# Patient Record
Sex: Male | Born: 1945 | Race: White | Hispanic: No | Marital: Married
Health system: Southern US, Community
[De-identification: ages and names within clinical notes are randomized; demographics above are authoritative.]

---

## 2012-10-08 ENCOUNTER — Inpatient Hospital Stay: Payer: Self-pay | Admitting: Internal Medicine

## 2012-10-08 LAB — COMPREHENSIVE METABOLIC PANEL
Albumin: 3.7 g/dL (ref 3.4–5.0)
Bilirubin,Total: 0.4 mg/dL (ref 0.2–1.0)
Calcium, Total: 9.1 mg/dL (ref 8.5–10.1)
Chloride: 110 mmol/L — ABNORMAL HIGH (ref 98–107)
Creatinine: 2.94 mg/dL — ABNORMAL HIGH (ref 0.60–1.30)
EGFR (African American): 25 — ABNORMAL LOW
Glucose: 109 mg/dL — ABNORMAL HIGH (ref 65–99)
Potassium: 4 mmol/L (ref 3.5–5.1)
SGOT(AST): 28 U/L (ref 15–37)
SGPT (ALT): 27 U/L (ref 12–78)
Sodium: 141 mmol/L (ref 136–145)
Total Protein: 8.1 g/dL (ref 6.4–8.2)

## 2012-10-08 LAB — URINALYSIS, COMPLETE
Bilirubin,UR: NEGATIVE
Glucose,UR: NEGATIVE mg/dL (ref 0–75)
Ketone: NEGATIVE
Nitrite: NEGATIVE
Protein: 30
RBC,UR: 2 /HPF (ref 0–5)
Specific Gravity: 1.018 (ref 1.003–1.030)
Squamous Epithelial: NONE SEEN
WBC UR: 1 /HPF (ref 0–5)

## 2012-10-08 LAB — TROPONIN I
Troponin-I: 0.17 ng/mL — ABNORMAL HIGH
Troponin-I: 0.05 ng/mL

## 2012-10-08 LAB — CBC
HCT: 46.9 % (ref 40.0–52.0)
MCHC: 34.4 g/dL (ref 32.0–36.0)
MCV: 87 fL (ref 80–100)
RBC: 5.37 10*6/uL (ref 4.40–5.90)
RDW: 15.1 % — ABNORMAL HIGH (ref 11.5–14.5)
WBC: 9.5 10*3/uL (ref 3.8–10.6)

## 2012-10-08 LAB — APTT
Activated PTT: 29.6 secs (ref 23.6–35.9)
Activated PTT: >160 secs (ref 23.6–35.9)

## 2012-10-08 LAB — CK TOTAL AND CKMB (NOT AT ARMC)
CK, Total: 75 U/L (ref 35–232)
CK, Total: 70 U/L (ref 35–232)
CK, Total: 100 U/L (ref 35–232)
CK-MB: 1.4 ng/mL (ref 0.5–3.6)
CK-MB: 2.4 ng/mL (ref 0.5–3.6)

## 2012-10-09 LAB — CBC WITH DIFFERENTIAL/PLATELET
Eosinophil #: 0 10*3/uL (ref 0.0–0.7)
Eosinophil %: 0 %
HCT: 39.7 % — ABNORMAL LOW (ref 40.0–52.0)
HGB: 13.6 g/dL (ref 13.0–18.0)
Lymphocyte #: 0.4 10*3/uL — ABNORMAL LOW (ref 1.0–3.6)
Lymphocyte %: 6 %
MCH: 30.1 pg (ref 26.0–34.0)
MCHC: 34.3 g/dL (ref 32.0–36.0)
MCV: 88 fL (ref 80–100)
Monocyte %: 1.9 %
Neutrophil #: 5.9 10*3/uL (ref 1.4–6.5)
Neutrophil %: 91.9 %
RBC: 4.52 10*6/uL (ref 4.40–5.90)
RDW: 14.8 % — ABNORMAL HIGH (ref 11.5–14.5)
WBC: 6.4 10*3/uL (ref 3.8–10.6)

## 2012-10-09 LAB — BASIC METABOLIC PANEL
Calcium, Total: 8.5 mg/dL (ref 8.5–10.1)
EGFR (African American): 26 — ABNORMAL LOW
EGFR (Non-African Amer.): 23 — ABNORMAL LOW
Glucose: 192 mg/dL — ABNORMAL HIGH (ref 65–99)
Potassium: 4 mmol/L (ref 3.5–5.1)
Sodium: 141 mmol/L (ref 136–145)

## 2012-10-09 LAB — LIPID PANEL
Cholesterol: 130 mg/dL (ref 0–200)
HDL Cholesterol: 31 mg/dL — ABNORMAL LOW (ref 40–60)
Ldl Cholesterol, Calc: 73 mg/dL (ref 0–100)
Triglycerides: 129 mg/dL (ref 0–200)
VLDL Cholesterol, Calc: 26 mg/dL (ref 5–40)

## 2012-10-09 LAB — APTT: Activated PTT: 90.3 secs — ABNORMAL HIGH (ref 23.6–35.9)

## 2012-10-10 LAB — CBC WITH DIFFERENTIAL/PLATELET
Basophil #: 0 10*3/uL (ref 0.0–0.1)
Eosinophil %: 0 %
HGB: 13.7 g/dL (ref 13.0–18.0)
Lymphocyte #: 0.4 10*3/uL — ABNORMAL LOW (ref 1.0–3.6)
Lymphocyte %: 4.3 %
MCHC: 34 g/dL (ref 32.0–36.0)
MCV: 89 fL (ref 80–100)
Monocyte #: 0.3 x10 3/mm (ref 0.2–1.0)
Monocyte %: 3.5 %
Neutrophil %: 92 %
Platelet: 134 10*3/uL — ABNORMAL LOW (ref 150–440)
RDW: 15.3 % — ABNORMAL HIGH (ref 11.5–14.5)

## 2012-10-10 LAB — BASIC METABOLIC PANEL
Anion Gap: 7 (ref 7–16)
BUN: 26 mg/dL — ABNORMAL HIGH (ref 7–18)
Calcium, Total: 8.7 mg/dL (ref 8.5–10.1)
Creatinine: 2.38 mg/dL — ABNORMAL HIGH (ref 0.60–1.30)
EGFR (Non-African Amer.): 27 — ABNORMAL LOW
Sodium: 139 mmol/L (ref 136–145)

## 2012-10-11 LAB — BASIC METABOLIC PANEL
Anion Gap: 5 — ABNORMAL LOW (ref 7–16)
Calcium, Total: 8.4 mg/dL — ABNORMAL LOW (ref 8.5–10.1)
Chloride: 109 mmol/L — ABNORMAL HIGH (ref 98–107)
Co2: 26 mmol/L (ref 21–32)
Potassium: 4.2 mmol/L (ref 3.5–5.1)

## 2012-10-13 LAB — CULTURE, BLOOD (SINGLE)

## 2014-01-28 IMAGING — CR DG CHEST 2V
1 series · 2 of 2 positions shown · non-contrast
Comparison: none

REASON FOR EXAM: SOB
COMMENTS:

PROCEDURE:     DXR - DXR CHEST PA (OR AP) AND LATERAL  - October 08, 2012 [DATE]
RESULT:     The lungs are clear. The cardiac silhouette and visualized bony
skeleton are unremarkable. Patient's taken a shallow inspiration which
accentuates the interstitial findings.

[Series 1: x chest ap · 0.14mm/px · 2 of 2 slices shown]
[im 1/2]
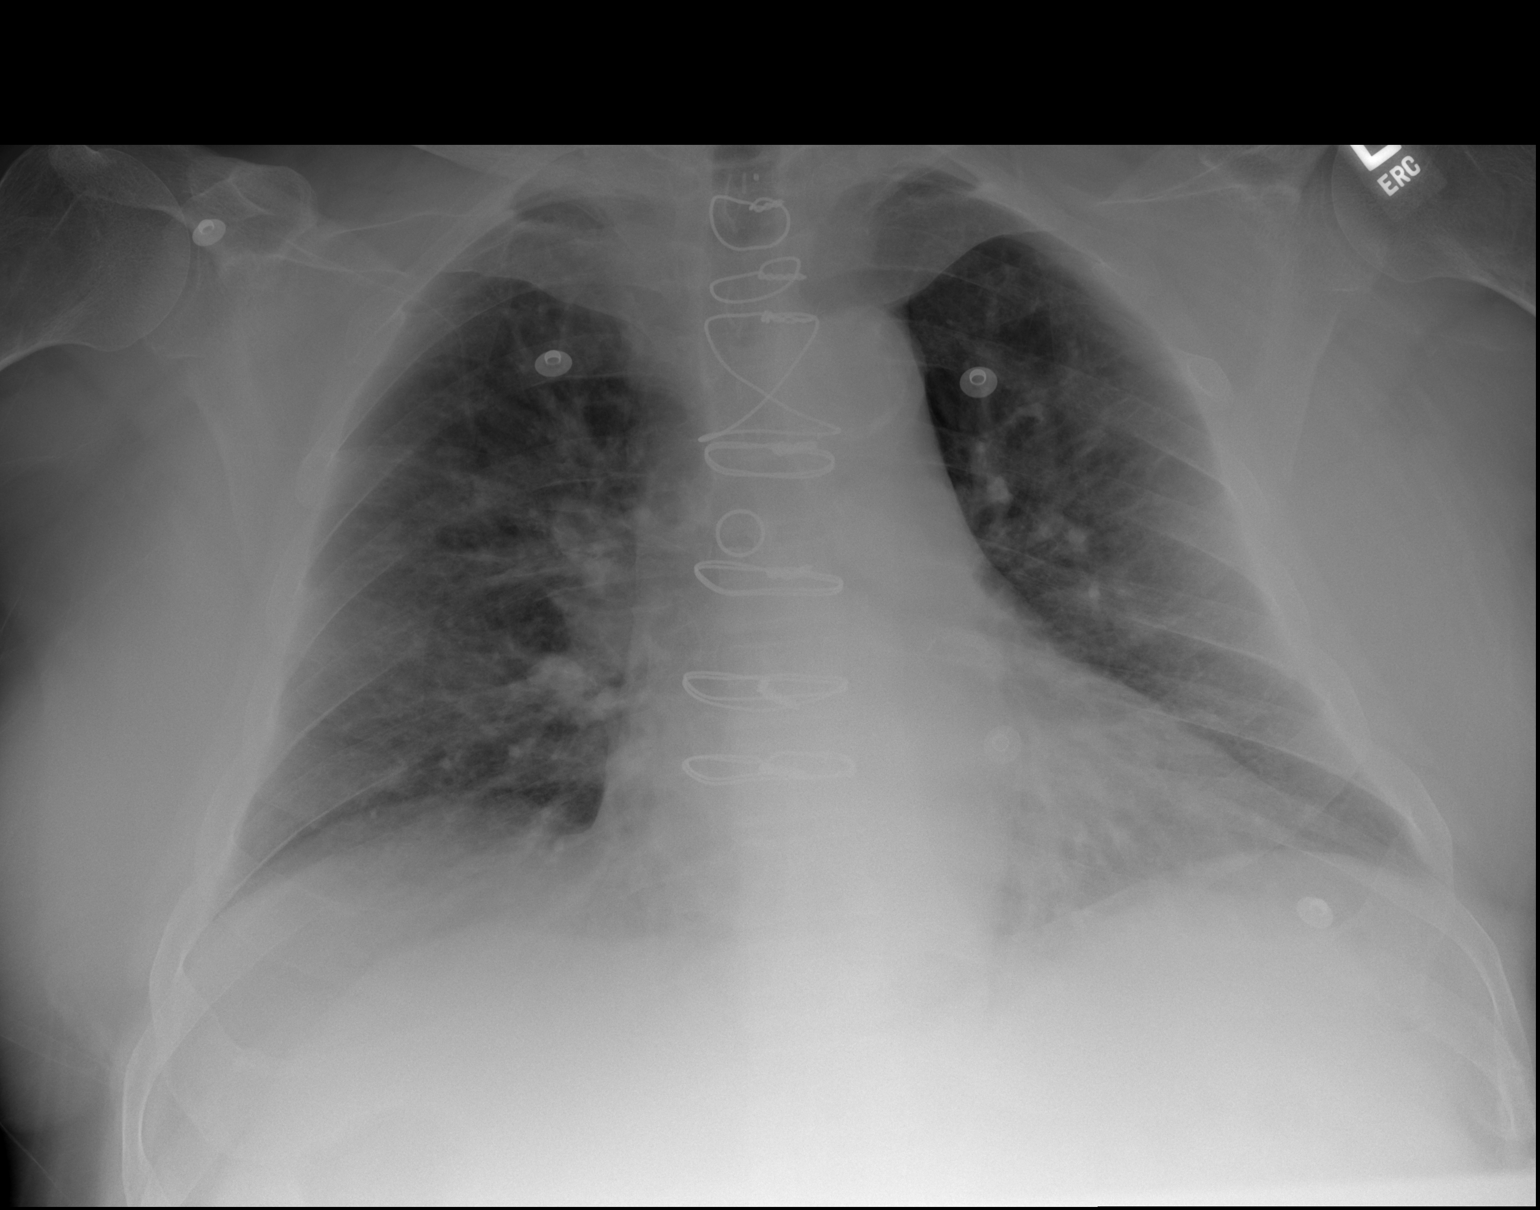
[im 2/2]
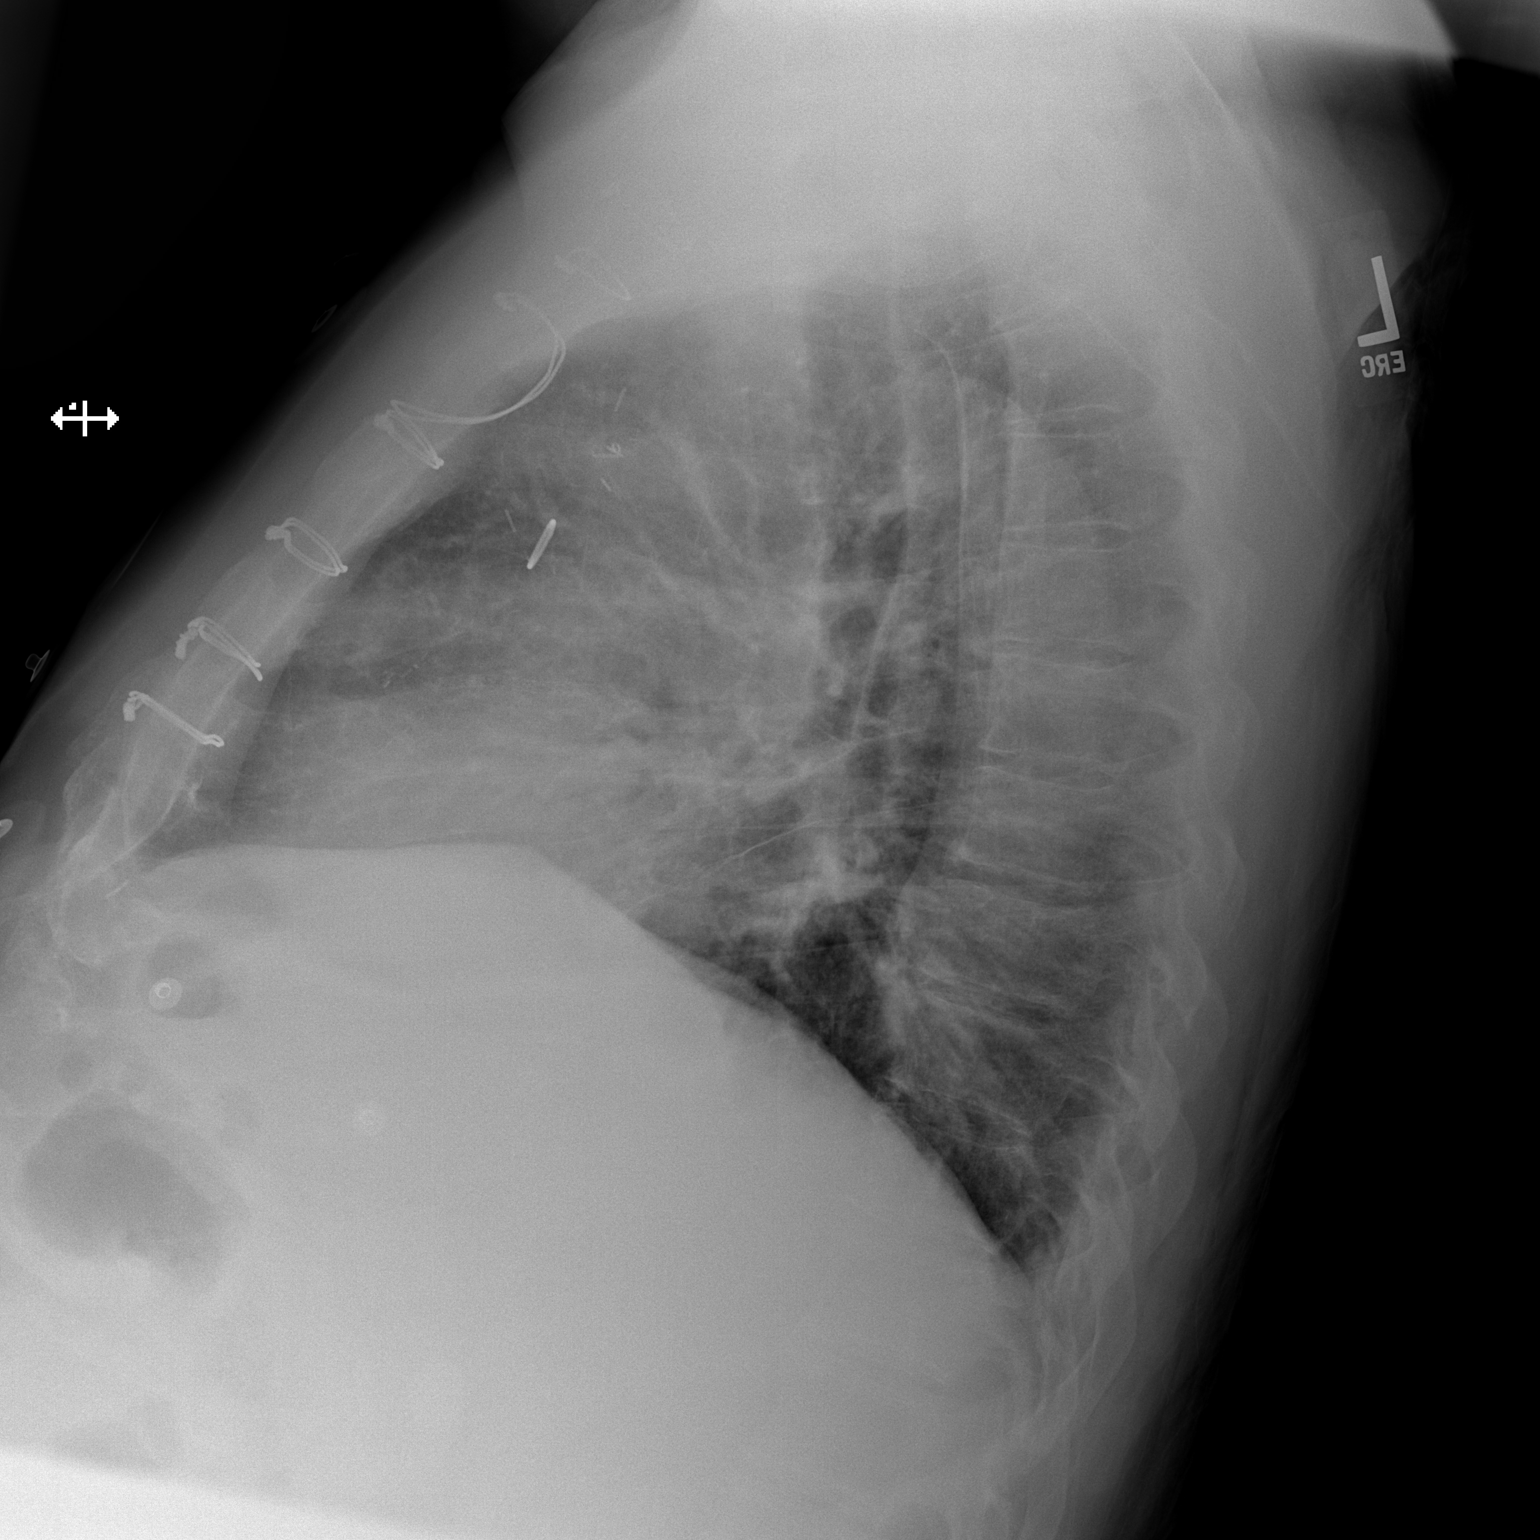

[2 of 2 positions shown; findings below may reference images not displayed]

IMPRESSION: 1. Chest radiograph without evidence of acute cardiopulmonary disease.

## 2014-01-29 IMAGING — CT CT ABD-PELV W/O CM
1 of 2 series · 14 of 32 positions shown, 18 images · non-contrast
Comparison: none

REASON FOR EXAM: (1) abdominal pain; (2) abdominal pain;    NOTE: Nursing
to Give Oral CT Contras
COMMENTS:

[Series 2: soft tissue · axial · 0.98mm/px · z∈[-1220,-689]mm · 14 of 195 slices shown, 18 images]
[im 9/195  soft-tissue]
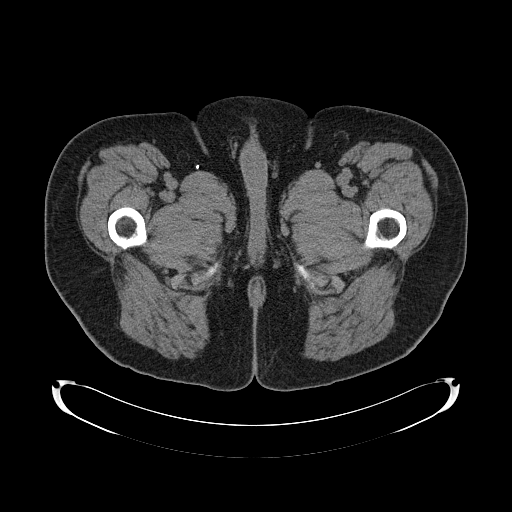
[im 9/195  bone]
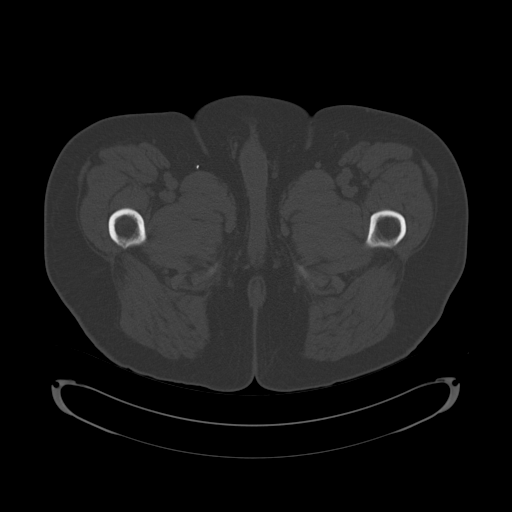
[im 26/195  soft-tissue]
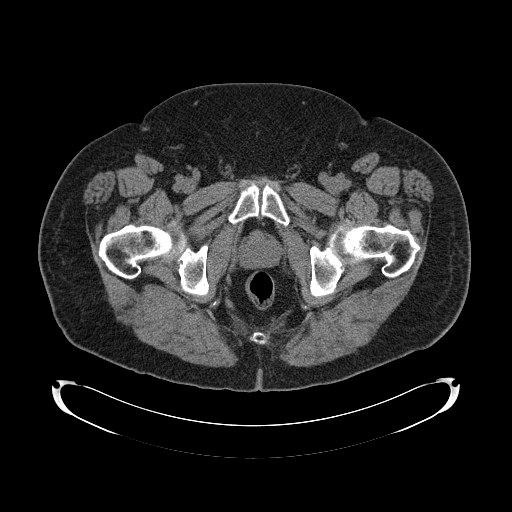
[im 43/195  soft-tissue]
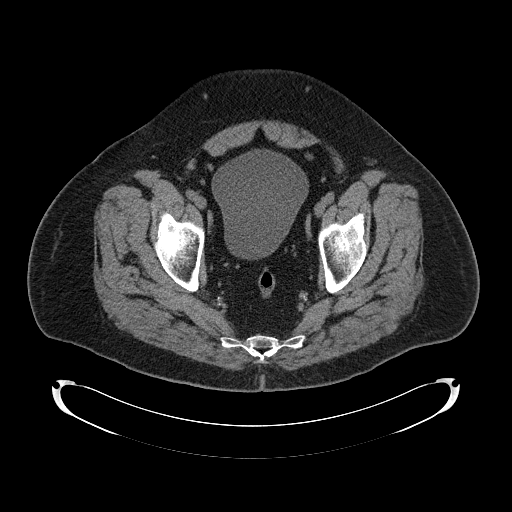
[im 60/195  soft-tissue]
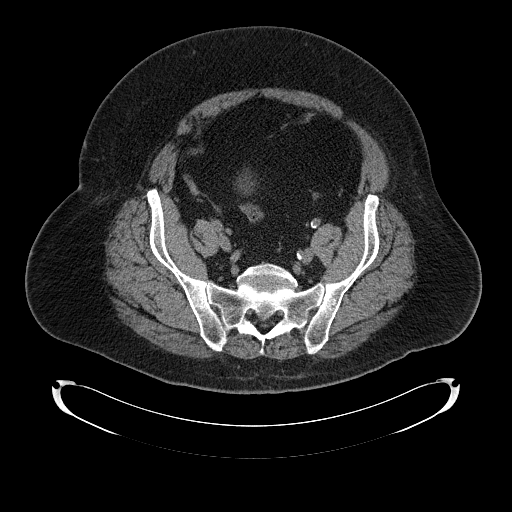
[im 76/195  soft-tissue]
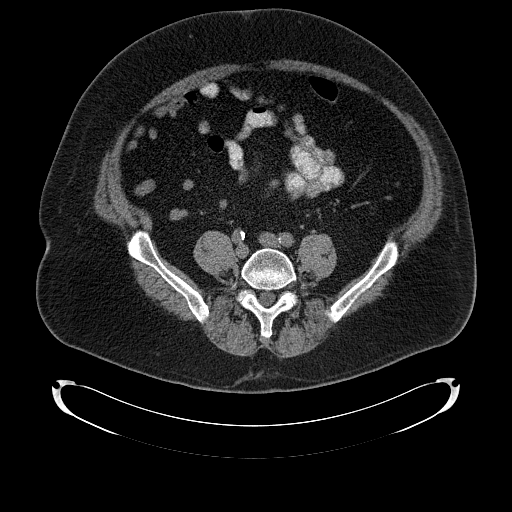
[im 93/195  soft-tissue]
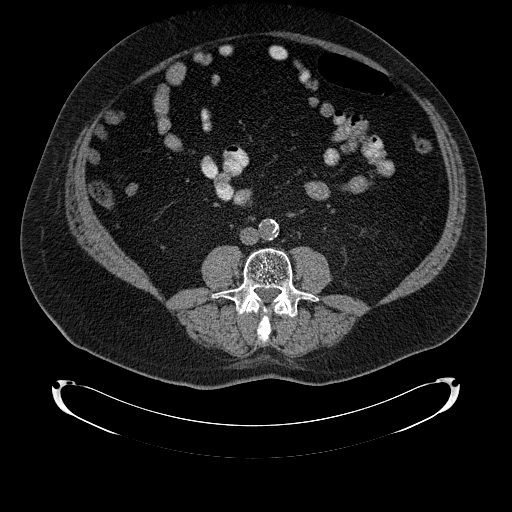
[im 102/195  soft-tissue]
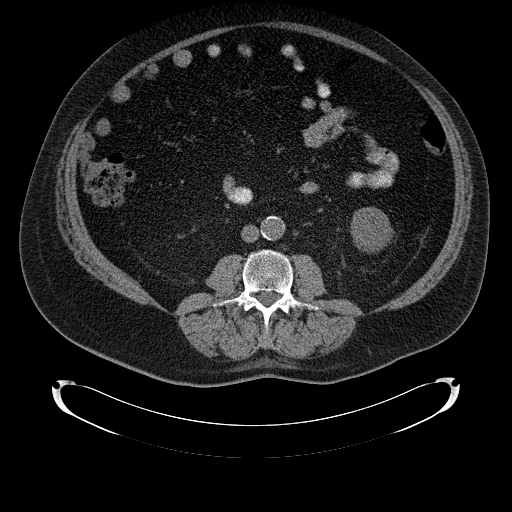
[im 119/195  soft-tissue]
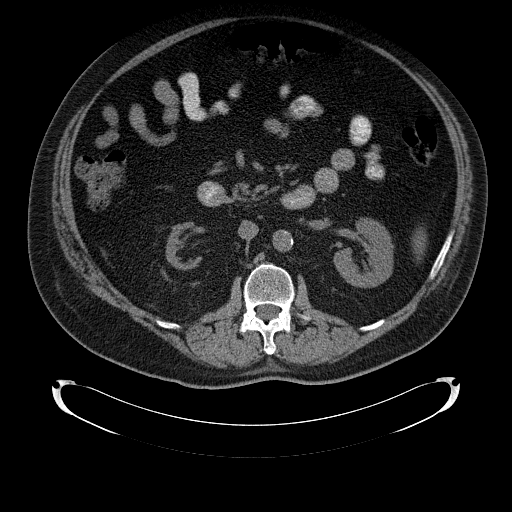
[im 135/195  soft-tissue]
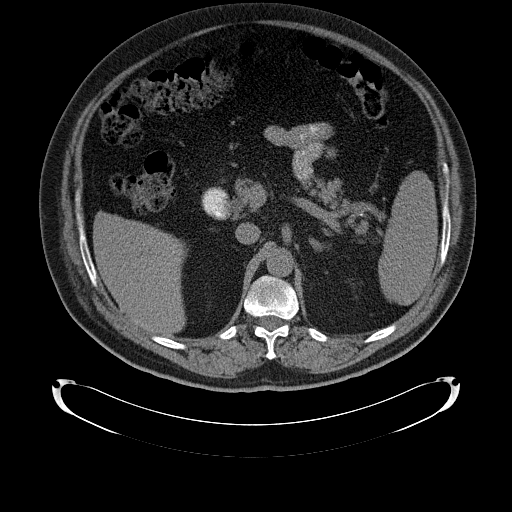
[im 135/195  bone]
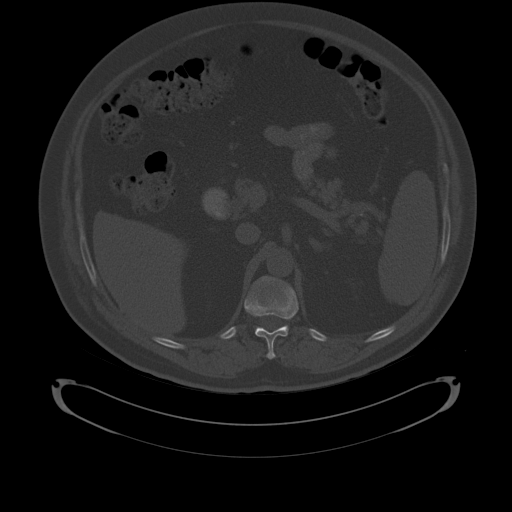
[im 152/195  soft-tissue]
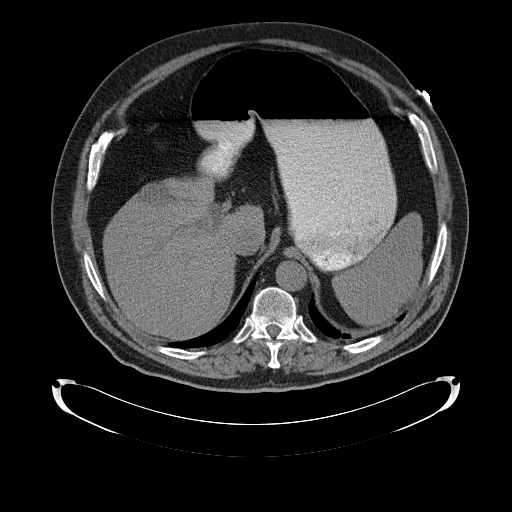
[im 161/195  lung]
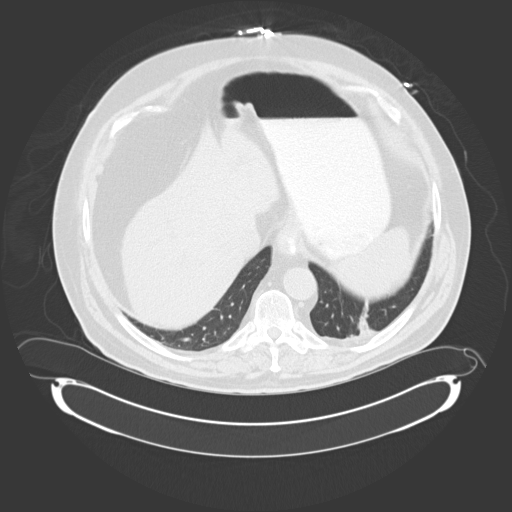
[im 169/195  soft-tissue]
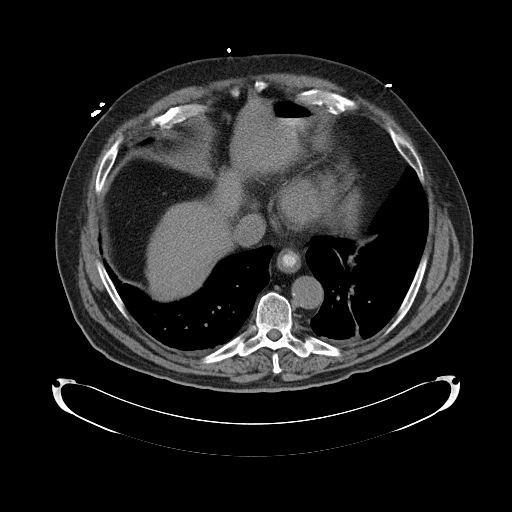
[im 169/195  lung]
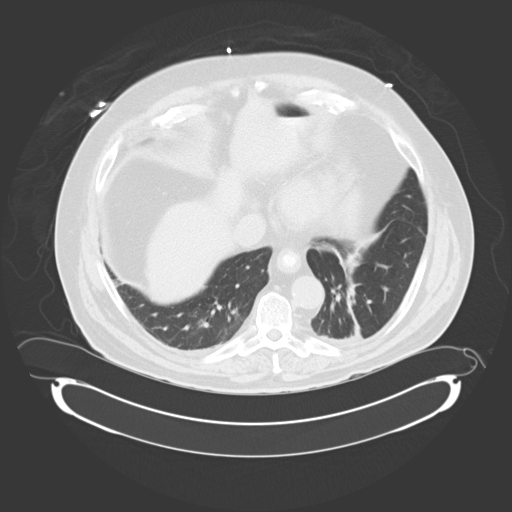
[im 178/195  lung]
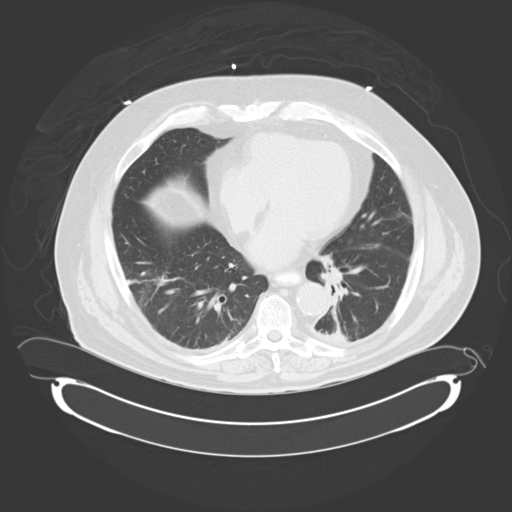
[im 186/195  soft-tissue]
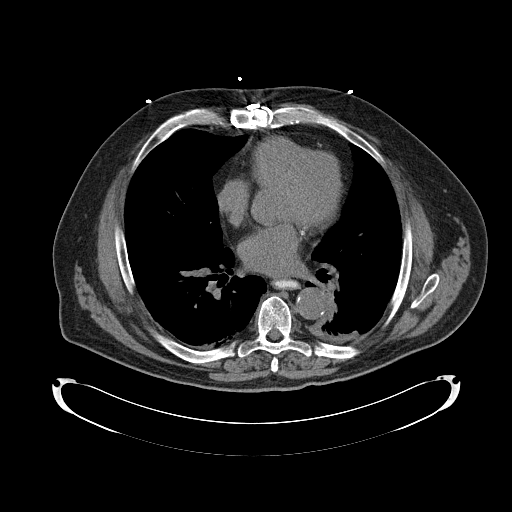
[im 186/195  lung]
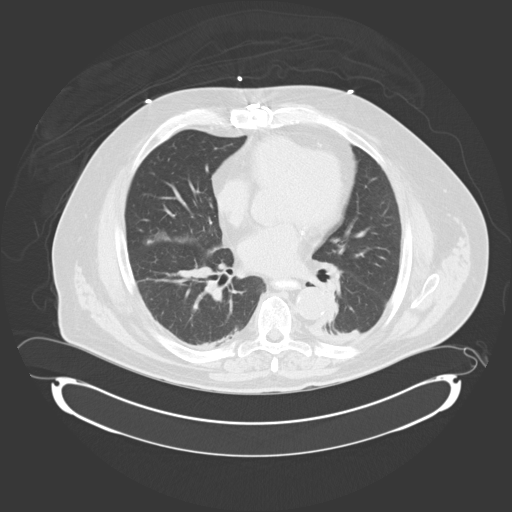

[14 of 32 positions shown; findings below may reference images not displayed]

PROCEDURE:     CT  - CT ABDOMEN AND PELVIS W[DATE]  [DATE]

RESULT:     Axial CT scanning was performed through the abdomen and pelvis
following administration of oral contrast material only. Review of
multiplanar reconstructed images was performed separately on the VIA monitor.

The stomach is moderately distended with contrast. A moderate amount of
contrast is present within portions of the proximal and mid small bowel.
None has reached the distal small bowel or colon. The small and large bowel
loops do not appear abnormally distended. There are no inflammatory changes
of the small or large bowel. The rectosigmoid colon is nondistended and
grossly normal in appearance.

There is a densely calcified gallstone measuring 1.8 cm in diameter in the
gallbladder neck. No pericholecystic inflammatory change is demonstrated.
The liver, spleen, pancreas, adrenal glands, and left kidney are normal in
appearance. The right kidney is atrophic. The abdominal aorta exhibits mild
failure to taper of its caliber. There is no evidence of an aneurysm. The
urinary bladder and prostate gland appear normal. There is no evidence of
ascites. No intra-abdominal or pelvic soft tissue density mass is
demonstrated.

The lumbar vertebral bodies are preserved in height. There is no inguinal or
umbilical hernia. The lung bases reveal patchy increased interstitial
densities in the left base and in the right perihilar and infrahilar regions.
IMPRESSION: 1. There is a large gallstone present without evidence of acute
cholecystitis.
2. The stomach is moderately distended with contrast but contrast has
reached the junction of the middle and distal thirds of the normal-appearing
small bowel. The colon also is normal in appearance.
3. There is right renal atrophy. The left kidney is normal in appearance.
The urinary bladder and prostate gland appear normal.
4. There is no intra-abdominal or pelvic lymphadenopathy. There is no
evidence of ascites.
5. There are patchy increased densities predominantly in the right mid and
lower lung and to a lesser extent at the left lung base. These findings are
worrisome for pneumonia. Followup imaging is recommended to assure clearing
following any antibiotic therapy.

[REDACTED]

## 2014-06-28 NOTE — Discharge Summary (Signed)
PATIENT NAME:  Marcus Hampton, [name] MR#:  [id number] DATE OF BIRTH:  Jul 10, 1945  DATE OF ADMISSION:  10/08/2012 DATE OF DISCHARGE:  10/11/2012  PRESENTING COMPLAINT: Shortness of breath and cough.   NOTE:  This is an addendum to the discharge summary already dictated by Dr. Auburn BilberryShreyang Deontra Pereyra on 10/09/2012. The patient remained in the hospital. The [facility]; however they did not have any beds available over there. Hence, the patient was treated for COPD exacerbation, acute bronchitis, and he improved over the hospital stay. He was ambulated by respiratory therapist. Sats remained 91% on exertion on room air. The patient will complete his antibiotics and steroid taper at home. He was also recommended to follow up [facility] for possible cardiac stress test as outpatient. The patient remained a FULL CODE. The rest of the discharge summary as per Dr. Auburn BilberryShreyang Melia Hopes that was dictated on 10/09/2012.   HOME MEDICATIONS: 1.  Omeprazole/sodium bicarbonate 1 capsule b.i.d.  2.  Docusate 50 mg 2 tablets at bedtime.  3.  Divalproex extended release 250 mg at bedtime.  4.  Lasix 40 mg daily.  5.  Calcitriol 0.25 mcg p.o. daily.  6.  Simvastatin 80 mg 1/2 tablet at bedtime.  7.  Lisinopril 40 mg daily.  8.  Bayer aspirin 81 mg daily.  9.  Metoprolol 25 mg 1/2 tablet b.i.d.  10.  Nitroglycerin 0.4 mg sublingual as needed.  11.  Cetirizine 10 mg p.o. daily.  12.  Prednisone taper.  13.  Keflex 500 mg 3 times a day. 14.  Zithromax daily.  15.  Albuterol ipratropium 1 puff 4 times a day.  16.  Advair 250/50 one puff b.i.d.   DIET: Low sodium.  ACTIVITY LIMITATIONS:  None.   DISCHARGE INSTRUCTIONS:  [facility] in 1 to 2 weeks. [facility] in 2 to 4 weeks.   DIAGNOSTICS:  Labs at discharge: Creatinine was 2.32, BUN is 28 and glucose is 113. White count is 9.8 and H and H are 13.7 and  40.5.   CT of the abdomen and pelvis without contrast showed large gallstone present without evidence of acute cholecystitis. The patient's stomach is moderately distended with contrast reaching the junction of the middle and distal thirds of the normal appearing small bowel. Colon is also normal in appearance. Renal atrophy on the right. There is no intraabdominal pelvic lymphadenopathy. There is patchy increased density predominately the right mid and lower lungs. These findings are worrisome for pneumonia.   Cholesterol is 130, triglycerides 129 and LDL is 73. Blood cultures remain negative in 18 to 24 hours. UA negative for UTI. ____________________________ Marcus KlinefelterSona A. Allena KatzPatel, MD sap:sb D: 10/12/2012 07:31:19 ET T: 10/12/2012 08:52:57 ET JOB#: [id number]  cc: Marcus Hampton A. Allena KatzPatel, MD, <Dictator> [facility] Willow OraSONA A Marcus Estell MD ELECTRONICALLY SIGNED 10/26/2012 7:30

## 2014-06-28 NOTE — Consult Note (Signed)
PATIENT NAME:  Marcus Hampton, [name]:  [id number] DATE OF BIRTH:  1946/01/14  DATE OF CONSULTATION:  10/08/2012  REFERRING PHYSICIAN:  Chiquita LothJade Sung, MD (Emergency Room) CONSULTING PHYSICIAN:  Dia Jefferys D. Juliann Paresallwood, MD  PRIME DOC:  Marlaine HindAmir Darwish, MD  PRIMARY CARE PHYSICIAN:  [facility] [place].   INDICATION: Shortness of breath, chest pain.   HISTORY OF PRESENT ILLNESS: Marcus Hampton is a 69 year old white male who [place] [facility] [place], very poor historian and difficult to get details. He is on multiple medications.  He was transported by EMS to the hospital for chest pain. He states that the problem started the day before he came with shortness of breath and some chest pain, left-sided, mid chest, sharp pain, 6 out of 10 without radiation. Denied nausea or vomiting. Pain continued throughout the time and finally went away after a few hours. He had had some coughing, brown sputum. He had a temperature 102. In the Emergency Room, he had a mildly elevated troponin of 0.17. Creatinine was elevated at 2.9. EKG showed old inferior myocardial infarction and nonspecific ST-T wave changes. Chest x-ray suggests pneumonia, however, in the right lower lobe so he is admitted for further evaluation and management.  REVIEW OF SYSTEMS: Denies blackout spells or syncope. Denies any recent nausea or vomiting. He has had a fever. He has had some chills and sweats, but no weight loss, no weight gain. No hemoptysis or hematemesis. Denies bright red blood per rectum. No vision change or hearing change. He has had some brown sputum. He has had some cough.  PAST MEDICAL HISTORY:  1.  Coronary artery disease with coronary artery bypass. 2.  Possible myocardial infarction in the past. Last cath was 2003. 3.  Hypercholesterolemia.  4.  COPD.  5.  Former smoker. 6.  Renal insufficiency. 7.  Psoriasis.   PAST SURGICAL HISTORY:  1.  Coronary artery bypass surgery, angioplasty and stent. 2.   Cardiac cath.   FAMILY HISTORY: Lung cancer.   SOCIAL HISTORY: He states he quit smoking about 7 years ago. Retired. Denies use of alcohol consumption. The patient does not know his medication.   ALLERGIES: No known drug allergies.   PHYSICAL EXAMINATION: VITAL SIGNS: Blood pressure 160/80, pulse 90, respiratory rate 20, temperature 102.5, sats 96%.  HEENT: Normocephalic, atraumatic. Pupils equal and reactive to light.  NECK: Supple. No significant JVD, bruits or adenopathy.  LUNGS: Clear to auscultation and percussion. He has some wheeze and mild rhonchi bilaterally, especially on the right lung and dullness in the right lung. No rales.  HEART: Distant. Regular rhythm. Systolic ejection murmur at the left sternal border. positive  S4.  ABDOMEN: Benign.  EXTREMITIES: Within normal limits.  NEUROLOGIC: Intact.  SKIN: Normal.   LABORATORY AND DIAGNOSTICS: Chest x-ray: Sternotomy and right lower lobe infiltrate.   EKG: Normal sinus rhythm, old inferior myocardial infarction, nonspecific findings.   Glucose 109, BUN 27, creatinine 2.9, sodium 141, potassium 4.  LFTs negative. Troponin 0.17. White count 9, hemoglobin 16, hematocrit 46, platelet count 120. UA unremarkable.   ASSESSMENT: 1.  Possible non-Q-wave myocardial infarction. 2.  Known coronary artery disease.  3.  Abnormal EKG. 4.  Right lower lobe pneumonia. 5.  Chronic obstructive pulmonary disease. 6.  Chronic renal insufficiency. 7.  Dyslipidemia. 8.  Psoriasis.  9.  Former smoker.   PLAN:  1.  Agree with admit to intensive care. Treat for respiratory problems. Treat for pneumonia. Broad-spectrum antibiotics for community-acquired  pneumonia. Oxygen therapy. Rehydration. Consider pulmonary input. Inhalers for chronic obstructive pulmonary disease as well as pneumonia.  2.  Renal insufficiency. Continue mild hydration. Consider nephrology input. Will stay away from nephrotoxic drugs.  3.  Steroid therapy and inhalers with  broad-spectrum antibiotics for pneumonia.  4.  For coronary artery disease, aspirin and Plavix as well as low dose beta blockers.  5.  Echocardiogram to assess LV function and wall motion.  6.  Anticoagulation both for unstable angina and possible non-Q-wave myocardial infarction as well as deep venous thrombosis prophylaxis. 7.  Try to obtain records from the [facility] as to what he is taking.  8.  Put him on lipid therapy. Hopefully we can get that information from his records. 9.  Consider gastroesophageal reflux disease therapy.  10.  Follow up ABG.   11.  Probably will not cath because of renal insufficiency and try to treat him medically. Once the pneumonia improves, see how he is doing and maintain a conservative cardiology presence.  ____________________________ Bobbie Stack. Juliann Pares, MD ddc:sb D: 10/20/2012 08:07:09 ET T: 10/20/2012 08:39:56 ET JOB#: [id number]  cc: Aleana Fifita D. Juliann Pares, MD, <Dictator> Alwyn Pea MD ELECTRONICALLY SIGNED 10/26/2012 12:30

## 2014-06-28 NOTE — Consult Note (Signed)
Brief Consult Note: Diagnosis: SOB/CRI/Elevated Troponi.   Patient was seen by consultant.   Consult note dictated.   Recommend further assessment or treatment.   Orders entered.   Discussed with Attending MD.   Comments: IMP SOB Bronchitis Borderline tropoinin CAD CRI AMS? CP/Angina? Marland Kitchen. PLAN ASA Anticoug Tele Agree with antibx IV [place] pulmonary input Doubt MI Agree with ECHO Hold off on cathfor now Treat resp infection first.  Electronic Signatures: Dorothyann Pengallwood, Dwayne D (MD)  (Signed 04-Aug-14 06:41)  Authored: Brief Consult Note   Last Updated: 04-Aug-14 06:41 by Dorothyann Pengallwood, Dwayne D (MD)

## 2014-06-28 NOTE — Discharge Summary (Signed)
PATIENT NAME:  Marcus Hampton, Marcus Hampton MR#:  [id number] DATE OF BIRTH:  [birth year]  DATE OF ADMISSION:  10/08/2012 [facility]     ADMITTING DIAGNOSIS: Shortness of breath, chest pain.   DISCHARGE DIAGNOSES: 1.  Shortness of breath, possibly due to chronic obstructive pulmonary disease flare, as well as acute bronchitis.  2.  Acute-on-chronic chronic obstructive pulmonary disease flare.  3.  Chest pain, possibly due to unstable angina with elevated troponins. Also elevated troponins could be due to demand ischemia.  4.  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  5.  Dyslipidemia.  6.  Abdominal pain and distention.  7.  Systolic dysfunction based on echocardiogram, mildly dilated left atrium, mildly dilated right atrium, mild mitral regurgitation.  8.  Coronary artery disease, status post coronary artery bypass graft, as well as stent placement.  9.  History of smoking.   CONSULTANTS: Cardiology, Dr. Juliann Pares.   PERTINENT LABORATORY, DIAGNOSTIC AND RADIOLOGIC DATA: Admitting glucose 109, BUN 27, creatinine 2.94, sodium 141, potassium 4.0, chloride 110, CO2 is 25. Calcium was 9.1. LFTs were normal. Troponin 0.17, 0.11, and then 0.05. WBC 9.5, hemoglobin 16.2, platelet count was 120. His urine culture, no growth. Blood cultures, no growth. Urinalysis: Nitrites negative, leukocytes negative.   EKG showed normal sinus rhythm with T wave inversions in the inferior leads, T wave inversions the lateral leads.   CT scan of the abdomen and pelvis showed large gallstones present without evidence of acute cholecystitis. Stomach is moderately distended with contrast, but contrast has reached the junction. No intra-abdominal or pelvic lymphadenopathy. Patchy areas of increased density in the right mid and lower lung, concern for possible pneumonia.   HOSPITAL COURSE: The patient is a 69 year old white male with a history of COPD, coronary artery disease, presents to the ED with  acute onset of shortness of breath and chest pain. The patient did have some nonspecific ST-T wave changes noted in the ED. He was admitted for further evaluation. In terms of his shortness of breath, the chest x-ray done was negative; however, a subsequent CT done today does show possible pneumonia. He has been on antibiotics. The patient also was thought to have COPD exacerbation and has been treated with nebulizers and steroids. His shortness of breath continues to persist. The patient also was complaining of having chest pain and was noted to have some elevated cardiac enzymes, nonspecific EKG changes. He was seen by cardiology. He had an echocardiogram done, which does show some wall motion abnormalities. The patient was thought to have elevated troponin, possibly due to demand ischemia; also could be due to unstable angina and underlying obstructive coronary artery disease, but with his creatinine, chronic kidney disease stage IV, he is at high risk of contrast nephropathy, so there is no intervention done at this point. The patient was also complaining of abdominal distention. He had a CT of the abdomen, which showed above results.   At this time, the patient is a [facility] [place] patient and request transfer to the [place], so arrangements are currently being made.   CURRENT MEDICATIONS: Aspirin 325 mg p.o. daily, atorvastatin 20 at bedtime, calcitriol 0.25 mcg daily, Plavix 75 p.o. daily, Depakote 250 daily, Solu-Medrol 40 IV q. 8, metoprolol 25 q. 6, morphine 2 to 4 mg IV q. 4 p.r.n., nitroglycerin 0.4 sublingual p.r.n., Zofran 4 mg IV q. 4 p.r.n., ranitidine 150 mg every 12 hours, ceftriaxone 2 g IV q. 24, azithromycin 500 q. 24, albuterol/Atrovent  nebulizers q. 4 while awake.  TIME SPENT: 35 minutes spent in arranging the transfer.    ____________________________ Lacie ScottsShreyang H. Allena KatzPatel, MD shp:cc D: 10/09/2012 16:12:00 ET T: 10/09/2012 17:41:34 ET JOB#: [id number]  cc: Dessa Ledee H. Allena KatzPatel, MD,  <Dictator>  Charise CarwinSHREYANG H Vieno Tarrant MD ELECTRONICALLY SIGNED 10/16/2012 10:26

## 2014-06-28 NOTE — H&P (Signed)
PATIENT NAME:  Marcus Hampton MR#:  [id number] DATE OF BIRTH:  07-31-1945  DATE OF ADMISSION:  10/08/2012  PRIMARY CARE PHYSICIAN:  [facility] in [place].   [place] PHYSICIAN:  Dr. Chiquita Loth.   CHIEF COMPLAINT:  Shortness of breath and chest pain.   HISTORY OF PRESENT ILLNESS:  Marcus Hampton is a 69 year old Caucasian male usually follows up at the [facility] in [place].  He is a poor historian, does not give details and it was difficult to obtain adequate information from him.  Additionally, he is taking multiple medications, but he does not know the names of his medicines.  He was transported from home by EMS to the hospital for evaluation of chest pain.  The patient indicates that his problem started yesterday with shortness of breath associated with some chest pain located at the left side of the chest and mid chest area described as sharp pain with severity of 6 on a scale of 10, nonradiating.  No associated nausea or vomiting.  The pain continued throughout that time until the last few hours when he started to have cough with brownish sputum and spiked a temperature reaching 102.  Evaluation here at the Emergency Department revealed an elevated troponin reaching 0.17.  His creatinine is also elevated at 2.9.  His EKG shows old inferior myocardial infarction and nonspecific ST-T wave abnormalities in the inferolateral leads.  His chest x-ray shows right lower lobe infiltrate.  The patient is right now in the preparation to be admitted to the Intensive Care Unit for evaluation of underlying pneumonia and non-ST-elevation acute myocardial infarction.   REVIEW OF SYSTEMS:  CONSTITUTIONAL:  Reports fever and chills and some fatigue.  EYES:  No blurring of vision.  No double vision.  EARS, NOSE, THROAT:  No hearing impairment.  No sore throat.  No dysphagia.  CARDIOVASCULAR:  Reports chest pain and shortness of breath as described above.  No edema.  No syncope.  RESPIRATORY:  Has shortness of  breath and chest pain associated with cough and brownish sputum.  No hemoptysis.  He is also wheezing.  GASTROINTESTINAL:  No abdominal pain, no vomiting, no diarrhea.  GENITOURINARY:  He had mild dysuria.  No frequency.  MUSCULOSKELETAL:  No joint pain or swelling.  No muscular pain or swelling.  INTEGUMENTARY:  He has chronic skin rash from his psoriasis.  No ulcers.  No subcutaneous nodules.  NEUROLOGY:  No focal weakness.  No seizure activity.  No headache.  PSYCHIATRY:  No anxiety or depression.  ENDOCRINE:  No polyuria or polydipsia.  No heat or cold intolerance.   PAST MEDICAL HISTORY:  Coronary artery disease status post coronary artery bypass graft a few years ago.  He does not recall exactly when.  A history of stent implant.  His last cardiac cath was done here in 2003 which showed significant disease of left circumflex artery.  At that time the patient was transported to [facility] for further interventions.  His other medical problems also include hypercholesterolemia, COPD, ex-chronic heavy smoker, possibly underlying chronic kidney disease, we need his records to confirm that.  Psoriasis.   PAST SURGICAL HISTORY:  Coronary artery bypass graft a few years ago.   FAMILY HISTORY:  His father died from lung cancer.  His mother is deceased.  He does not recall the cause of her death.   SOCIAL HABITS:  Ex-chronic heavy smoker.  He quit 6 to 7 years ago, prior to that he used to smoke 4 packs a day  since the age of 65.  That is a 135 pack-year.  No history of alcohol or other drug abuse.   SOCIAL HISTORY:  He is married, living with his wife.  He is retired from working as a [employer].   ADMISSION MEDICATIONS:  He has no clue of what medication he is taking.  He says he takes many medications, but remembers that one of them is for cholesterol.   ALLERGIES:  No known drug allergies.   PHYSICAL EXAMINATION: VITAL SIGNS:  Blood pressure 164/86, respiratory rate 22, pulse 92, temperature  102.5, oxygen saturation 96%.  GENERAL APPEARANCE:  Elderly male lying in bed, appears in mild respiratory difficulty.  HEAD AND NECK:  No pallor.  No icterus.  No cyanosis.  Ear examination revealed normal hearing, no discharge, no lesions.  Examination of the nose showed no discharge, no lesions.  No ulcers.  No bleeding.  Oropharyngeal examination revealed normal lips and tongue.  No oral thrush, no ulcers.  Eye examination revealed normal eyelids and conjunctivae.  Pupils about 4 to 5 mm, round, equal and reactive to light.  NECK:  Supple.  Trachea at midline.  No thyromegaly.  No cervical lymphadenopathies.  HEART:  Revealed distant heart sounds, faint S1 and S2.  No S3 or S4.  No murmur was appreciated.  No carotid bruits. RESPIRATORY:  Revealed prolonged expiratory phase and diffuse wheezing bilaterally.  No rales.  The patient is not using any accessory muscles.  There is decreased breathing sounds bilaterally.  ABDOMEN:  Obese, soft without tenderness.  No hepatosplenomegaly.  No masses.  No hernias.  SKIN:  Revealed psoriasis skin lesions, more extensive at and below the left knee.  A few lesions over the elbow area.  No other subcutaneous nodules or ulcerations.  MUSCULOSKELETAL:  No joint swelling.  No clubbing.   NEUROLOGIC:  Cranial nerves II through XII are intact.  No focal motor deficit.  PSYCHIATRIC:  The patient is alert, oriented x 3.  Mood and affect were normal.   LABORATORY FINDINGS:  His chest x-ray showed evidence of previous sternotomy.  Right lower lobe infiltrates.  EKG showed normal sinus rhythm at rate of 95 per minute, old inferior myocardial infarction.  Nonspecific ST-T wave abnormalities in the inferolateral leads.  His serum glucose was 109, BUN 27, creatinine 2.9 with estimated GFR at 21.  Sodium 141, potassium 4.  Normal liver function tests and liver transaminases.  Troponin elevated at 0.17.  CBC showed a white count of 9000, hemoglobin 16, hematocrit 46, platelet  count 120.  Urinalysis was unremarkable.   ASSESSMENT: 1.  Chest pain associated with elevated troponin, highly suspicious for non-ST-elevation acute myocardial infarction, although the chest pain could be attributed to the presence of pneumonia and elevated troponin could be from his chronic kidney disease.  2.  Right lower lobe pneumonia.  3.  Acute exacerbation of chronic obstructive pulmonary disease precipitated by the pneumonia. 4.  Chronic kidney disease stage 4.  5.  Mild thrombocytopenia.  6.  Coronary artery disease status post stent implant and coronary artery bypass graft.  7.  Dyslipidemia.  8.  Psoriasis.  9.  Ex-chronic heavy smoker.   PLAN:  We will admit the patient to the Intensive Care Unit.  Blood cultures were taken at the Emergency Department and the patient is started on IV Rocephin and Zithromax.  We will start DuoNeb q. 4 hours as needed.  A small dose of IV Solu-Medrol 40 mg once a day.  Oxygen supplementation.  Aspirin and Plavix.  IV heparin protocol started.  Add statin.  Beta-blocker.  We will obtain echocardiogram.  Cardiology consult.  For deep vein thrombosis prophylaxis, he is already started on IV heparin for full anticoagulation.  The patient's CODE STATUS IS FULL CODE.  I ordered his records to be brought from [facility] in [place], in particular his last admission and discharge summary, his medication list, any consults.  I also spoke with his grandson who is going to make an attempt to go to his home and bring me the list of his medications.   Time spent in evaluating this patient took more than 1 hour.    ____________________________ Carney CornersAmir M. Rudene Rearwish, MD amd:ea D: 10/08/2012 02:42:46 ET T: 10/08/2012 03:20:58 ET JOB#: [id number]  cc: Carney CornersAmir M. Rudene Rearwish, MD, <Dictator> Zollie ScaleAMIR M Alixander Rallis MD ELECTRONICALLY SIGNED 10/10/2012 6:58

## 2017-11-06 DEATH — deceased
